# Patient Record
Sex: Female | Born: 1950 | Race: White | Hispanic: No | Marital: Single | Smoking: Never smoker
Health system: Southern US, Community
[De-identification: ages and names within clinical notes are randomized; demographics above are authoritative.]

## PROBLEM LIST (undated history)

## (undated) DIAGNOSIS — R05 Cough: Secondary | ICD-10-CM

## (undated) DIAGNOSIS — N329 Bladder disorder, unspecified: Secondary | ICD-10-CM

## (undated) DIAGNOSIS — H9190 Unspecified hearing loss, unspecified ear: Secondary | ICD-10-CM

## (undated) DIAGNOSIS — M791 Myalgia, unspecified site: Secondary | ICD-10-CM

## (undated) DIAGNOSIS — R112 Nausea with vomiting, unspecified: Secondary | ICD-10-CM

## (undated) DIAGNOSIS — R059 Cough, unspecified: Secondary | ICD-10-CM

## (undated) DIAGNOSIS — Z9889 Other specified postprocedural states: Secondary | ICD-10-CM

## (undated) DIAGNOSIS — K589 Irritable bowel syndrome without diarrhea: Secondary | ICD-10-CM

## (undated) DIAGNOSIS — J349 Unspecified disorder of nose and nasal sinuses: Secondary | ICD-10-CM

## (undated) DIAGNOSIS — F419 Anxiety disorder, unspecified: Secondary | ICD-10-CM

## (undated) DIAGNOSIS — J45909 Unspecified asthma, uncomplicated: Secondary | ICD-10-CM

## (undated) DIAGNOSIS — K219 Gastro-esophageal reflux disease without esophagitis: Secondary | ICD-10-CM

## (undated) DIAGNOSIS — M79669 Pain in unspecified lower leg: Secondary | ICD-10-CM

## (undated) DIAGNOSIS — R609 Edema, unspecified: Secondary | ICD-10-CM

## (undated) HISTORY — PX: COLONOSCOPY: SHX174

## (undated) HISTORY — PX: DILATION AND CURETTAGE OF UTERUS: SHX78

## (undated) HISTORY — PX: KNEE SURGERY: SHX244

## (undated) HISTORY — DX: Gastro-esophageal reflux disease without esophagitis: K21.9

## (undated) HISTORY — DX: Myalgia, unspecified site: M79.10

## (undated) HISTORY — DX: Unspecified hearing loss, unspecified ear: H91.90

## (undated) HISTORY — DX: Pain in unspecified lower leg: M79.669

## (undated) HISTORY — PX: UPPER GI ENDOSCOPY: SHX6162

## (undated) HISTORY — DX: Anxiety disorder, unspecified: F41.9

## (undated) HISTORY — DX: Unspecified disorder of nose and nasal sinuses: J34.9

## (undated) HISTORY — DX: Irritable bowel syndrome, unspecified: K58.9

## (undated) HISTORY — DX: Unspecified asthma, uncomplicated: J45.909

## (undated) HISTORY — DX: Cough, unspecified: R05.9

## (undated) HISTORY — DX: Bladder disorder, unspecified: N32.9

## (undated) HISTORY — DX: Edema, unspecified: R60.9

## (undated) HISTORY — DX: Cough: R05

---

## 1999-01-11 ENCOUNTER — Other Ambulatory Visit: Admission: RE | Admit: 1999-01-11 | Discharge: 1999-01-11 | Payer: Self-pay | Admitting: Obstetrics and Gynecology

## 2000-05-24 ENCOUNTER — Ambulatory Visit (HOSPITAL_COMMUNITY): Admission: RE | Admit: 2000-05-24 | Discharge: 2000-05-24 | Payer: Self-pay | Admitting: Gastroenterology

## 2001-02-18 ENCOUNTER — Ambulatory Visit (HOSPITAL_BASED_OUTPATIENT_CLINIC_OR_DEPARTMENT_OTHER): Admission: RE | Admit: 2001-02-18 | Discharge: 2001-02-18 | Payer: Self-pay | Admitting: *Deleted

## 2001-04-30 HISTORY — PX: GALLBLADDER SURGERY: SHX652

## 2001-08-11 ENCOUNTER — Encounter: Admission: RE | Admit: 2001-08-11 | Discharge: 2001-11-09 | Payer: Self-pay

## 2001-08-19 ENCOUNTER — Inpatient Hospital Stay (HOSPITAL_COMMUNITY): Admission: EM | Admit: 2001-08-19 | Discharge: 2001-08-23 | Payer: Self-pay | Admitting: Family Medicine

## 2001-09-11 ENCOUNTER — Encounter: Payer: Self-pay | Admitting: Family Medicine

## 2001-09-11 ENCOUNTER — Encounter: Admission: RE | Admit: 2001-09-11 | Discharge: 2001-09-11 | Payer: Self-pay | Admitting: Family Medicine

## 2001-09-28 ENCOUNTER — Emergency Department (HOSPITAL_COMMUNITY): Admission: EM | Admit: 2001-09-28 | Discharge: 2001-09-29 | Payer: Self-pay | Admitting: Emergency Medicine

## 2001-09-30 ENCOUNTER — Ambulatory Visit (HOSPITAL_COMMUNITY): Admission: RE | Admit: 2001-09-30 | Discharge: 2001-09-30 | Payer: Self-pay | Admitting: Gastroenterology

## 2001-09-30 ENCOUNTER — Encounter: Payer: Self-pay | Admitting: Gastroenterology

## 2001-10-07 ENCOUNTER — Ambulatory Visit (HOSPITAL_COMMUNITY): Admission: RE | Admit: 2001-10-07 | Discharge: 2001-10-07 | Payer: Self-pay | Admitting: Gastroenterology

## 2001-10-07 ENCOUNTER — Encounter: Payer: Self-pay | Admitting: Gastroenterology

## 2001-11-13 ENCOUNTER — Encounter: Admission: RE | Admit: 2001-11-13 | Discharge: 2001-11-13 | Payer: Self-pay | Admitting: Family Medicine

## 2001-11-13 ENCOUNTER — Encounter: Payer: Self-pay | Admitting: Family Medicine

## 2002-04-03 ENCOUNTER — Encounter: Payer: Self-pay | Admitting: Surgery

## 2002-04-03 ENCOUNTER — Observation Stay (HOSPITAL_COMMUNITY): Admission: RE | Admit: 2002-04-03 | Discharge: 2002-04-04 | Payer: Self-pay | Admitting: Surgery

## 2003-10-01 ENCOUNTER — Other Ambulatory Visit: Admission: RE | Admit: 2003-10-01 | Discharge: 2003-10-01 | Payer: Self-pay | Admitting: Obstetrics and Gynecology

## 2003-11-22 ENCOUNTER — Ambulatory Visit (HOSPITAL_COMMUNITY): Admission: RE | Admit: 2003-11-22 | Discharge: 2003-11-22 | Payer: Self-pay | Admitting: Gastroenterology

## 2004-09-22 ENCOUNTER — Ambulatory Visit (HOSPITAL_BASED_OUTPATIENT_CLINIC_OR_DEPARTMENT_OTHER): Admission: RE | Admit: 2004-09-22 | Discharge: 2004-09-22 | Payer: Self-pay | Admitting: Otolaryngology

## 2004-09-24 ENCOUNTER — Ambulatory Visit: Payer: Self-pay | Admitting: Internal Medicine

## 2004-12-04 ENCOUNTER — Other Ambulatory Visit: Admission: RE | Admit: 2004-12-04 | Discharge: 2004-12-04 | Payer: Self-pay | Admitting: Obstetrics and Gynecology

## 2011-06-08 ENCOUNTER — Other Ambulatory Visit: Payer: Self-pay | Admitting: Occupational Medicine

## 2011-06-08 ENCOUNTER — Ambulatory Visit: Payer: Self-pay

## 2011-06-08 DIAGNOSIS — M25559 Pain in unspecified hip: Secondary | ICD-10-CM

## 2012-09-11 ENCOUNTER — Ambulatory Visit
Admission: RE | Admit: 2012-09-11 | Discharge: 2012-09-11 | Disposition: A | Discharge status: Home / Self Care | Payer: BC Managed Care – PPO | Source: Ambulatory Visit | Attending: Family Medicine | Admitting: Family Medicine

## 2012-09-11 ENCOUNTER — Other Ambulatory Visit: Payer: Self-pay | Admitting: Family Medicine

## 2012-09-11 DIAGNOSIS — M542 Cervicalgia: Secondary | ICD-10-CM

## 2012-09-11 DIAGNOSIS — S0990XA Unspecified injury of head, initial encounter: Secondary | ICD-10-CM

## 2013-03-19 ENCOUNTER — Ambulatory Visit (INDEPENDENT_AMBULATORY_CARE_PROVIDER_SITE_OTHER): Payer: BC Managed Care – PPO

## 2013-03-19 ENCOUNTER — Encounter (INDEPENDENT_AMBULATORY_CARE_PROVIDER_SITE_OTHER): Payer: Self-pay

## 2013-03-19 VITALS — BP 131/83 | HR 79 | Resp 18

## 2013-03-19 DIAGNOSIS — M7750 Other enthesopathy of unspecified foot: Secondary | ICD-10-CM

## 2013-03-19 DIAGNOSIS — M7751 Other enthesopathy of right foot: Secondary | ICD-10-CM

## 2013-03-19 DIAGNOSIS — M2170 Unequal limb length (acquired), unspecified site: Secondary | ICD-10-CM

## 2013-03-19 NOTE — Patient Instructions (Signed)

## 2013-03-19 NOTE — Progress Notes (Signed)
  Subjective:    Patient ID: Michelle Klein, female    DOB: 1951/04/03, 62 y.o.   MRN: [id number]  HPI right foot has been bothering me for about 2009 and hurt my leg and i had knee surgery and ankle swells and going to physical therapy for range of motion leg and back and foot and the orthotic is not in a neutral position and goes to the outside of the right foot    Review of Systems  Constitutional: Negative.   HENT: Positive for congestion.   Eyes: Negative.   Respiratory: Positive for cough.   Cardiovascular: Negative.   Gastrointestinal:       IBS and hianal hernia  Endocrine: Negative.   Genitourinary:       Leaky bladder   Musculoskeletal: Positive for back pain.  Skin: Negative.   Allergic/Immunologic: Positive for environmental allergies.       Dust   Neurological: Negative.   Hematological: Negative.   Psychiatric/Behavioral: Negative.        Objective:   Physical Exam Neurovascular status is intact with pedal pulses palpable DP and PT posterior were for Refill time 3 seconds all digits epicritic and proprioceptive sensations appear to be intact although maybe diminished on the right side secondary to history of right knee surgery replacement back in 2009. Patient does have some difficulty of instability and gait is going to physical therapy right now since several falls. Patient feels like she is walking on the outside of her foot or rolling into a varus position however exam patient does have roughly normal range of motion ankle subtalar and midtarsal joints there is no crepitus patient does have rectus foot type slight rotatory changes on weightbearing without orthoses. The orthotic she has has a heel lift on the right side and she is only to prescription C. secondary to her surgery. The orthotic currently wearing is posterior to neutral position there is no varus or valgus posting on the orthotic and factors maintaining a neutral position of the foot patient was  advised I would not recommend any valgus posting to the orthotic which would in fact cause a valgus stress on the knee and possibly exacerbate any knee or hip problems she may have. I do feel the issue continues be a matter of proprioceptive sensations secondary to her previous surgeries and lack of input from her knee.      Assessment & Plan:  Assessment this time patient continues to have some hindfoot rear foot capsulitis and promontory changes maintain plantar fascial symptomology with functional orthoses are posted to a neutral or vertical position. Patient's orthotics are wearing and did request and At this time we'll schedule for orthotic casting for a new orthotic at her convenience with him next week or 2 at this time maintain orthotics is currently a fit and contour well continue with exercise programs with physical therapy help improve proprioceptive sensation and strength. Followup when orthotics ready for fitting and  Alvan Dame DP

## 2013-04-01 ENCOUNTER — Other Ambulatory Visit: Payer: BC Managed Care – PPO

## 2014-02-12 ENCOUNTER — Other Ambulatory Visit: Payer: Self-pay | Admitting: Orthopedic Surgery

## 2014-02-24 ENCOUNTER — Encounter (HOSPITAL_BASED_OUTPATIENT_CLINIC_OR_DEPARTMENT_OTHER): Payer: Self-pay | Admitting: *Deleted

## 2014-02-24 NOTE — Progress Notes (Signed)
No labs needed

## 2014-02-26 ENCOUNTER — Encounter (HOSPITAL_BASED_OUTPATIENT_CLINIC_OR_DEPARTMENT_OTHER): Payer: Medicare Other | Admitting: Certified Registered"

## 2014-02-26 ENCOUNTER — Encounter (HOSPITAL_BASED_OUTPATIENT_CLINIC_OR_DEPARTMENT_OTHER)
Admission: RE | Disposition: A | Discharge status: Home / Self Care | Payer: Self-pay | Source: Ambulatory Visit | Attending: Orthopedic Surgery

## 2014-02-26 ENCOUNTER — Encounter (HOSPITAL_BASED_OUTPATIENT_CLINIC_OR_DEPARTMENT_OTHER): Payer: Self-pay | Admitting: Certified Registered"

## 2014-02-26 ENCOUNTER — Ambulatory Visit (HOSPITAL_BASED_OUTPATIENT_CLINIC_OR_DEPARTMENT_OTHER): Payer: Medicare Other | Admitting: Certified Registered"

## 2014-02-26 ENCOUNTER — Ambulatory Visit (HOSPITAL_BASED_OUTPATIENT_CLINIC_OR_DEPARTMENT_OTHER)
Admission: RE | Admit: 2014-02-26 | Discharge: 2014-02-26 | Disposition: A | Discharge status: Home / Self Care | Payer: Medicare Other | Source: Ambulatory Visit | Attending: Orthopedic Surgery | Admitting: Orthopedic Surgery

## 2014-02-26 DIAGNOSIS — M67442 Ganglion, left hand: Secondary | ICD-10-CM | POA: Insufficient documentation

## 2014-02-26 DIAGNOSIS — M19042 Primary osteoarthritis, left hand: Secondary | ICD-10-CM | POA: Diagnosis not present

## 2014-02-26 DIAGNOSIS — K589 Irritable bowel syndrome without diarrhea: Secondary | ICD-10-CM | POA: Insufficient documentation

## 2014-02-26 DIAGNOSIS — H9190 Unspecified hearing loss, unspecified ear: Secondary | ICD-10-CM | POA: Diagnosis not present

## 2014-02-26 DIAGNOSIS — Z880 Allergy status to penicillin: Secondary | ICD-10-CM | POA: Diagnosis not present

## 2014-02-26 DIAGNOSIS — Z885 Allergy status to narcotic agent status: Secondary | ICD-10-CM | POA: Insufficient documentation

## 2014-02-26 DIAGNOSIS — F419 Anxiety disorder, unspecified: Secondary | ICD-10-CM | POA: Insufficient documentation

## 2014-02-26 DIAGNOSIS — J45909 Unspecified asthma, uncomplicated: Secondary | ICD-10-CM | POA: Diagnosis not present

## 2014-02-26 DIAGNOSIS — K219 Gastro-esophageal reflux disease without esophagitis: Secondary | ICD-10-CM | POA: Insufficient documentation

## 2014-02-26 DIAGNOSIS — Z79899 Other long term (current) drug therapy: Secondary | ICD-10-CM | POA: Diagnosis not present

## 2014-02-26 DIAGNOSIS — Z882 Allergy status to sulfonamides status: Secondary | ICD-10-CM | POA: Diagnosis not present

## 2014-02-26 DIAGNOSIS — Z886 Allergy status to analgesic agent status: Secondary | ICD-10-CM | POA: Diagnosis not present

## 2014-02-26 DIAGNOSIS — L729 Follicular cyst of the skin and subcutaneous tissue, unspecified: Secondary | ICD-10-CM | POA: Diagnosis present

## 2014-02-26 HISTORY — PX: MASS EXCISION: SHX2000

## 2014-02-26 HISTORY — DX: Nausea with vomiting, unspecified: R11.2

## 2014-02-26 HISTORY — DX: Other specified postprocedural states: Z98.890

## 2014-02-26 LAB — POCT HEMOGLOBIN-HEMACUE: Hemoglobin: 13.4 g/dL (ref 12.0–15.0)

## 2014-02-26 SURGERY — EXCISION MASS
Anesthesia: Monitor Anesthesia Care | Site: Finger | Laterality: Left

## 2014-02-26 MED ORDER — MIDAZOLAM HCL 5 MG/5ML IJ SOLN
INTRAMUSCULAR | Status: DC | PRN
  Administered 2014-02-26: 2 mg via INTRAVENOUS

## 2014-02-26 MED ORDER — FENTANYL CITRATE 0.05 MG/ML IJ SOLN
INTRAMUSCULAR | Status: AC
  Filled 2014-02-26: qty 4

## 2014-02-26 MED ORDER — HYDROCODONE-ACETAMINOPHEN 5-325 MG PO TABS: ORAL_TABLET | ORAL | Status: AC

## 2014-02-26 MED ORDER — PROPOFOL 10 MG/ML IV BOLUS
INTRAVENOUS | Status: DC | PRN
  Administered 2014-02-26: 40 mg via INTRAVENOUS

## 2014-02-26 MED ORDER — VANCOMYCIN HCL IN DEXTROSE 1-5 GM/200ML-% IV SOLN
INTRAVENOUS | Status: AC
  Filled 2014-02-26: qty 200

## 2014-02-26 MED ORDER — LACTATED RINGERS IV SOLN
INTRAVENOUS | Status: DC
  Administered 2014-02-26 (×2): via INTRAVENOUS

## 2014-02-26 MED ORDER — ONDANSETRON HCL 4 MG/2ML IJ SOLN
INTRAMUSCULAR | Status: DC | PRN
  Administered 2014-02-26: 4 mg via INTRAVENOUS

## 2014-02-26 MED ORDER — MIDAZOLAM HCL 2 MG/2ML IJ SOLN: 1.0000 mg | INTRAMUSCULAR | Status: DC | PRN

## 2014-02-26 MED ORDER — FENTANYL CITRATE 0.05 MG/ML IJ SOLN: 50.0000 ug | INTRAMUSCULAR | Status: DC | PRN

## 2014-02-26 MED ORDER — MIDAZOLAM HCL 2 MG/2ML IJ SOLN
INTRAMUSCULAR | Status: AC
  Filled 2014-02-26: qty 2

## 2014-02-26 MED ORDER — FENTANYL CITRATE 0.05 MG/ML IJ SOLN
INTRAMUSCULAR | Status: DC | PRN
  Administered 2014-02-26 (×2): 50 ug via INTRAVENOUS

## 2014-02-26 MED ORDER — CHLORHEXIDINE GLUCONATE 4 % EX LIQD: 60.0000 mL | Freq: Once | CUTANEOUS | Status: DC

## 2014-02-26 MED ORDER — PROPOFOL INFUSION 10 MG/ML OPTIME
INTRAVENOUS | Status: DC | PRN
  Administered 2014-02-26: 100 ug/kg/min via INTRAVENOUS

## 2014-02-26 MED ORDER — VANCOMYCIN HCL IN DEXTROSE 1-5 GM/200ML-% IV SOLN
1000.0000 mg | INTRAVENOUS | Status: AC
  Administered 2014-02-26: 1000 mg via INTRAVENOUS

## 2014-02-26 MED ORDER — BUPIVACAINE HCL (PF) 0.25 % IJ SOLN
INTRAMUSCULAR | Status: DC | PRN
Start: 2014-02-26 — End: 2014-02-26
  Administered 2014-02-26: 10 mL

## 2014-02-26 SURGICAL SUPPLY — 58 items
APL SKNCLS STERI-STRIP NONHPOA (GAUZE/BANDAGES/DRESSINGS)
BANDAGE COBAN STERILE 2 (GAUZE/BANDAGES/DRESSINGS) IMPLANT
BANDAGE ELASTIC 3 VELCRO ST LF (GAUZE/BANDAGES/DRESSINGS) IMPLANT
BENZOIN TINCTURE PRP APPL 2/3 (GAUZE/BANDAGES/DRESSINGS) IMPLANT
BLADE MINI RND TIP GREEN BEAV (BLADE) IMPLANT
BLADE SURG 15 STRL LF DISP TIS (BLADE) ×2 IMPLANT
BLADE SURG 15 STRL SS (BLADE) ×6
BNDG CMPR 9X4 STRL LF SNTH (GAUZE/BANDAGES/DRESSINGS)
BNDG CMPR MD 5X2 ELC HKLP STRL (GAUZE/BANDAGES/DRESSINGS)
BNDG COHESIVE 1X5 TAN STRL LF (GAUZE/BANDAGES/DRESSINGS) ×2 IMPLANT
BNDG CONFORM 2 STRL LF (GAUZE/BANDAGES/DRESSINGS) IMPLANT
BNDG ELASTIC 2 VLCR STRL LF (GAUZE/BANDAGES/DRESSINGS) IMPLANT
BNDG ESMARK 4X9 LF (GAUZE/BANDAGES/DRESSINGS) IMPLANT
BNDG GAUZE 1X2.1 STRL (MISCELLANEOUS) IMPLANT
BNDG GAUZE ELAST 4 BULKY (GAUZE/BANDAGES/DRESSINGS) IMPLANT
BNDG PLASTER X FAST 3X3 WHT LF (CAST SUPPLIES) IMPLANT
BNDG PLSTR 9X3 FST ST WHT (CAST SUPPLIES)
CHLORAPREP W/TINT 26ML (MISCELLANEOUS) ×3 IMPLANT
CLOSURE WOUND 1/2 X4 (GAUZE/BANDAGES/DRESSINGS)
CORDS BIPOLAR (ELECTRODE) ×3 IMPLANT
COVER BACK TABLE 60X90IN (DRAPES) ×3 IMPLANT
COVER MAYO STAND STRL (DRAPES) ×3 IMPLANT
CUFF TOURNIQUET SINGLE 18IN (TOURNIQUET CUFF) ×3 IMPLANT
DRAPE EXTREMITY T 121X128X90 (DRAPE) ×3 IMPLANT
DRAPE SURG 17X23 STRL (DRAPES) ×3 IMPLANT
GAUZE SPONGE 4X4 12PLY STRL (GAUZE/BANDAGES/DRESSINGS) ×3 IMPLANT
GAUZE XEROFORM 1X8 LF (GAUZE/BANDAGES/DRESSINGS) ×3 IMPLANT
GLOVE BIO SURGEON STRL SZ7.5 (GLOVE) ×3 IMPLANT
GLOVE BIOGEL M 7.0 STRL (GLOVE) ×4 IMPLANT
GLOVE BIOGEL PI IND STRL 7.5 (GLOVE) IMPLANT
GLOVE BIOGEL PI IND STRL 8 (GLOVE) ×1 IMPLANT
GLOVE BIOGEL PI INDICATOR 7.5 (GLOVE) ×2
GLOVE BIOGEL PI INDICATOR 8 (GLOVE) ×2
GOWN STRL REUS W/ TWL LRG LVL3 (GOWN DISPOSABLE) ×1 IMPLANT
GOWN STRL REUS W/TWL LRG LVL3 (GOWN DISPOSABLE) ×3
GOWN STRL REUS W/TWL XL LVL3 (GOWN DISPOSABLE) ×3 IMPLANT
NDL HYPO 25X1 1.5 SAFETY (NEEDLE) ×1 IMPLANT
NEEDLE HYPO 25X1 1.5 SAFETY (NEEDLE) ×3 IMPLANT
NS IRRIG 1000ML POUR BTL (IV SOLUTION) ×3 IMPLANT
PACK BASIN DAY SURGERY FS (CUSTOM PROCEDURE TRAY) ×3 IMPLANT
PAD CAST 3X4 CTTN HI CHSV (CAST SUPPLIES) IMPLANT
PAD CAST 4YDX4 CTTN HI CHSV (CAST SUPPLIES) IMPLANT
PADDING CAST ABS 4INX4YD NS (CAST SUPPLIES) ×2
PADDING CAST ABS COTTON 4X4 ST (CAST SUPPLIES) ×1 IMPLANT
PADDING CAST COTTON 3X4 STRL (CAST SUPPLIES)
PADDING CAST COTTON 4X4 STRL (CAST SUPPLIES)
SPLINT FINGER 3.25 911903 (SOFTGOODS) ×2 IMPLANT
STOCKINETTE 4X48 STRL (DRAPES) ×3 IMPLANT
STRIP CLOSURE SKIN 1/2X4 (GAUZE/BANDAGES/DRESSINGS) IMPLANT
SUT ETHILON 3 0 PS 1 (SUTURE) IMPLANT
SUT ETHILON 4 0 PS 2 18 (SUTURE) ×3 IMPLANT
SUT ETHILON 5 0 P 3 18 (SUTURE)
SUT NYLON ETHILON 5-0 P-3 1X18 (SUTURE) IMPLANT
SUT VIC AB 4-0 P2 18 (SUTURE) IMPLANT
SYR BULB 3OZ (MISCELLANEOUS) ×3 IMPLANT
SYR CONTROL 10ML LL (SYRINGE) ×3 IMPLANT
TOWEL OR 17X24 6PK STRL BLUE (TOWEL DISPOSABLE) ×6 IMPLANT
UNDERPAD 30X30 INCONTINENT (UNDERPADS AND DIAPERS) ×3 IMPLANT

## 2014-02-26 NOTE — H&P (Signed)
Michelle Klein is an 63 y.o. female.   Chief Complaint: left index mucoid cyst HPI: 63 yo rhd female states she has had cyst on left index finger x several months.  It is bothersome to her.  Had it aspirated once with recurrence.  She wishes to have it removed.  Past Medical History  Diagnosis Date  . Hearing loss   . Sinus problem   . Muscle pain   . Swelling   . Calf pain   . Bladder problem   . Asthma   . Cough   . GERD (gastroesophageal reflux disease)   . IBS (irritable bowel syndrome)   . Anxiety   . PONV (postoperative nausea and vomiting)     Past Surgical History  Procedure Laterality Date  . Gallbladder surgery  2003  . Knee surgery  [phone]    right knee x 3   . Dilation and curettage of uterus    . Upper gi endoscopy      x3  . Colonoscopy      History reviewed. No pertinent family history. Social History:  reports that she has never smoked. She has never used smokeless tobacco. She reports that she drinks alcohol. She reports that she does not use illicit drugs.  Allergies:  Allergies  Allergen Reactions  . Codeine Nausea And Vomiting  . Penicillins Hives  . Sulfa Antibiotics Nausea And Vomiting  . Ultram [Tramadol] Hives  . Adhesive [Tape] Rash    "sometimes causes rash"    Medications Prior to Admission  Medication Sig Dispense Refill  . ADVAIR DISKUS 100-50 MCG/DOSE AEPB       . ALPRAZolam (XANAX) 0.25 MG tablet Take 0.25 mg by mouth at bedtime as needed for anxiety.      Marland Kitchen. dexlansoprazole (DEXILANT) 60 MG capsule Take 60 mg by mouth daily.      Marland Kitchen. SINGULAIR 10 MG tablet at bedtime.       . sucralfate (CARAFATE) 1 G tablet Take 1 g by mouth 3 (three) times daily between meals. And hs      . dextromethorphan-guaiFENesin (MUCINEX DM) 30-600 MG per 12 hr tablet Take 1 tablet by mouth 2 (two) times daily.      Marland Kitchen. NASONEX 50 MCG/ACT nasal spray       . NEXIUM 40 MG capsule as needed.         Results for orders placed during the hospital  encounter of 02/26/14 (from the past 48 hour(s))  POCT HEMOGLOBIN-HEMACUE     Status: None   Collection Time    02/26/14 12:20 PM      Result Value Ref Range   Hemoglobin 13.4  12.0 - 15.0 g/dL    No results found.   A comprehensive review of systems was negative.  Blood pressure 148/77, pulse 84, temperature 98.2 F (36.8 C), temperature source Oral, resp. rate 18, height 5\' 4"  (1.626 m), weight 83.915 kg (185 lb), SpO2 97.00%.  General appearance: alert, cooperative and appears stated age Head: Normocephalic, without obvious abnormality, atraumatic Neck: supple, symmetrical, trachea midline Resp: clear to auscultation bilaterally Cardio: regular rate and rhythm GI: non tender Extremities: intact sensation and capillary refill all digits.  +epl/fpl/io.  ttp over cyst left index finger.  no wounds. Pulses: 2+ and symmetric Skin: Skin color, texture, turgor normal. 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  Non operative and operative treatment options were discussed with the patient and  patient wishes to proceed with operative treatment. Risks, benefits, and alternatives of surgery were discussed and the patient agrees with the plan of care.   Naphtali Zywicki R 02/26/2014, 1:21 PM

## 2014-02-26 NOTE — Discharge Instructions (Addendum)

## 2014-02-26 NOTE — Anesthesia Postprocedure Evaluation (Signed)
  Anesthesia Post-op Note  Patient: Michelle Klein  Procedure(s) Performed: Procedure(s): LEFT INDEX EXCISION MASS/DEBRIDEMENT DISTAL INTERPHALANGEAL JOINT (Left)  Patient Location: PACU  Anesthesia Type: MAC   Level of Consciousness: awake, alert  and oriented  Airway and Oxygen Therapy: Patient Spontanous Breathing  Post-op Pain: none  Post-op Assessment: Post-op Vital signs reviewed  Post-op Vital Signs: Reviewed  Last Vitals:  Filed Vitals:   02/26/14 1520  BP: 136/68  Pulse: 70  Temp: 36.5 C  Resp: 16    Complications: No apparent anesthesia complications

## 2014-02-26 NOTE — Anesthesia Procedure Notes (Signed)
Procedure Name: MAC Date/Time: 02/26/2014 1:39 PM Performed by: Curly ShoresRAFT, Mayvis Agudelo W Pre-anesthesia Checklist: Patient identified, Emergency Drugs available, Suction available and Patient being monitored Patient Re-evaluated:Patient Re-evaluated prior to inductionOxygen Delivery Method: Simple face mask Ventilation: Mask ventilation without difficulty Dental Injury: Teeth and Oropharynx as per pre-operative assessment

## 2014-02-26 NOTE — Transfer of Care (Signed)
Immediate Anesthesia Transfer of Care Note  Patient: Michelle Klein  Procedure(s) Performed: Procedure(s): LEFT INDEX EXCISION MASS/DEBRIDEMENT DISTAL INTERPHALANGEAL JOINT (Left)  Patient Location: PACU  Anesthesia Type:MAC  Level of Consciousness: awake, alert , oriented and patient cooperative  Airway & Oxygen Therapy: Patient Spontanous Breathing and Patient connected to face mask oxygen  Post-op Assessment: Report given to PACU RN, Post -op Vital signs reviewed and stable and Patient moving all extremities  Post vital signs: Reviewed and stable  Complications: No apparent anesthesia complications

## 2014-02-26 NOTE — Op Note (Signed)
[id number] 

## 2014-02-26 NOTE — Anesthesia Preprocedure Evaluation (Addendum)
Anesthesia Evaluation  Patient identified by MRN, date of birth, ID band Patient awake    Reviewed: Allergy & Precautions, H&P , NPO status , Patient's Chart, lab work & pertinent test results  History of Anesthesia Complications (+) PONV  Airway Mallampati: I  TM Distance: >3 FB Neck ROM: Full    Dental  (+) Teeth Intact, Dental Advisory Given   Pulmonary asthma ,  breath sounds clear to auscultation        Cardiovascular Rhythm:Regular Rate:Normal     Neuro/Psych    GI/Hepatic GERD-  Medicated and Controlled,  Endo/Other    Renal/GU      Musculoskeletal   Abdominal   Peds  Hematology   Anesthesia Other Findings   Reproductive/Obstetrics                             Anesthesia Physical Anesthesia Plan  ASA: II  Anesthesia Plan: MAC   Post-op Pain Management:    Induction: Intravenous  Airway Management Planned: Simple Face Mask  Additional Equipment:   Intra-op Plan:   Post-operative Plan: Extubation in OR  Informed Consent: I have reviewed the patients History and Physical, chart, labs and discussed the procedure including the risks, benefits and alternatives for the proposed anesthesia with the patient or authorized representative who has indicated his/her understanding and acceptance.   Dental advisory given  Plan Discussed with: CRNA, Anesthesiologist and Surgeon  Anesthesia Plan Comments:         Anesthesia Quick Evaluation

## 2014-02-26 NOTE — Brief Op Note (Signed)
02/26/2014  2:06 PM  PATIENT:  Michelle Klein  63 y.o. female  PRE-OPERATIVE DIAGNOSIS:  LEFT INDEX MUCOID CYST/DIP ARTHROSIS  POST-OPERATIVE DIAGNOSIS:  LEFT INDEX MUCOID CYST/DIP ARTHROSIS  PROCEDURE:  Procedure(s): LEFT INDEX EXCISION MASS/DEBRIDEMENT DISTAL INTERPHALANGEAL JOINT (Left)  SURGEON:  Surgeon(s) and Role:    * Betha LoaKevin Jeremih Dearmas, MD - Primary  PHYSICIAN ASSISTANT:   ASSISTANTS: none   ANESTHESIA:   local and MAC  EBL:  Total I/O In: 1000 [I.V.:1000] Out: -   BLOOD ADMINISTERED:none  DRAINS: none   LOCAL MEDICATIONS USED:  MARCAINE     SPECIMEN:  Source of Specimen:  left index finger  DISPOSITION OF SPECIMEN:  PATHOLOGY  COUNTS:  YES  TOURNIQUET:   Total Tourniquet Time Documented: Forearm (Left) - 15 minutes Total: Forearm (Left) - 15 minutes   DICTATION: .Other Dictation: Dictation Number [id number]  PLAN OF CARE: Discharge to home after PACU  PATIENT DISPOSITION:  PACU - hemodynamically stable.

## 2014-02-27 NOTE — Op Note (Signed)
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  PROCEDURE:  Left index finger excision of mass and debridement of distal interphalangeal joint.  SURGEON:  Betha LoaKevin Kaylean Tupou, MD  ASSISTANT:  None.  ANESTHESIA:  MAC with digital block.  IV FLUIDS:  Per anesthesia flow sheet.  ESTIMATED BLOOD LOSS:  Minimal.  COMPLICATIONS:  None.  SPECIMENS:  [name].  TOURNIQUET TIME:  15 minutes.  DISPOSITION:  Stable to PACU.  INDICATIONS:  [name] is a 63 year old female who has noted a cyst on the left index finger for several months.  It is bothersome to her. It has been aspirated once with recurrence.  She wishes to have it excised.  Risks, benefits, and alternatives of surgery were discussed including risk of blood loss, infection, damage to nerves, vessels, tendons, ligaments, bone; failure of surgery; need for additional surgery, complications with wound healing, continued pain, recurrence of the cyst.  She voiced understanding of these risks and elected to proceed.  OPERATIVE COURSE:  After being identified preoperatively by myself, the patient and I agreed upon procedure and site of procedure.  Surgical site was marked.  The risks, benefits, and alternatives of surgery were reviewed and she wished to proceed.  Surgical consent had been signed. She was given IV Ancef as preoperative antibiotic prophylaxis.  She was transferred to the operating room and placed on the operating room table in supine  position with the left upper extremity on arm board.  General anesthesia was induced by the anesthesiologist.  The left upper extremity was prepped and draped in normal sterile orthopedic fashion. Surgical pause was performed between surgeons, anesthesia, and operating room staff, and all were in agreement as to the patient, procedure, site of procedure.  Tourniquet at the proximal aspect of the forearm was inflated to 250 mmHg after exsanguination of the limb with Esmarch bandage.  A digital block had been performed with 10 mL of 0.25% plain Marcaine prior to prepping and draping, but after a surgical pause.  A hockey-stick shaped incision was made at the DIP joint of the left index finger and carried into subcutaneous tissues by spreading technique. The mass was identified.  It was excised and sent to Pathology for examination.  There was also some mass on the radial side of the DIP joint as well.  This was also removed.  The joint was entered from underneath the extensor tendon and debrided.  Synovectomy was performed with synovectomy rongeurs.  The wound was copiously irrigated with sterile saline.  It was closed with 4-0 nylon in a horizontal mattress fashion.  It was then dressed with sterile Xeroform, 4x4, and wrapped  with a Coban dressing.  An Alumafoam splint was placed and wrapped with Coban dressing lightly as well.  Tourniquet was deflated at 15 minutes. Fingertips were pink with brisk capillary refill after deflation of the tourniquet.  Operative drapes were broken down and the patient was awoken from anesthesia safely.  She was transferred back to stretcher and taken to PACU in stable condition.  I will see her back in the office in 1 week for postoperative followup.  I will give her Norco 5/325, one to two p.o. q.6 hours p.r.n. pain, dispensed #30.     Betha LoaKevin Brookley Spitler, MD     KK/MEDQ  D:  02/26/2014  T:  02/27/2014  Job:  [id number]

## 2014-09-06 IMAGING — CR DG CERVICAL SPINE COMPLETE 4+V
6 series · 6 of 6 positions shown · non-contrast
Comparison: Thoracic spine examination.

CLINICAL DATA: History of injury from fall.  History of pain.

CERVICAL SPINE - COMPLETE 4+ VIEW

[view not recorded (1 of 6)]
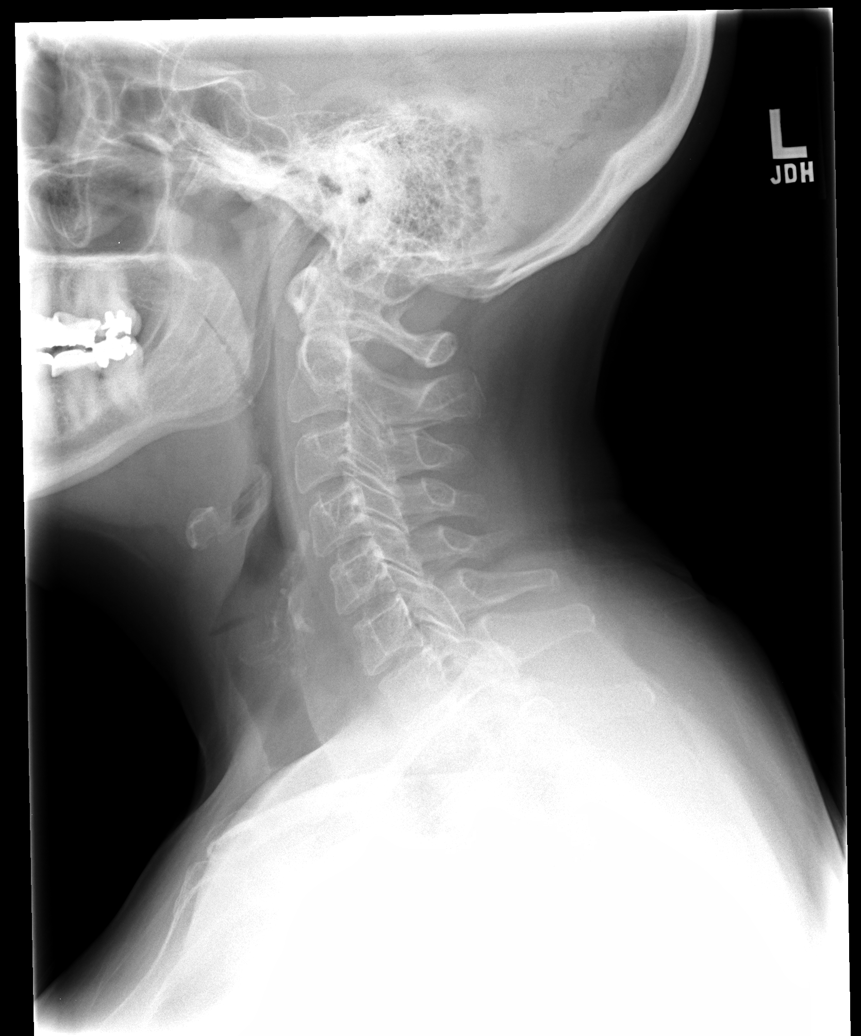

[view not recorded (2 of 6)]
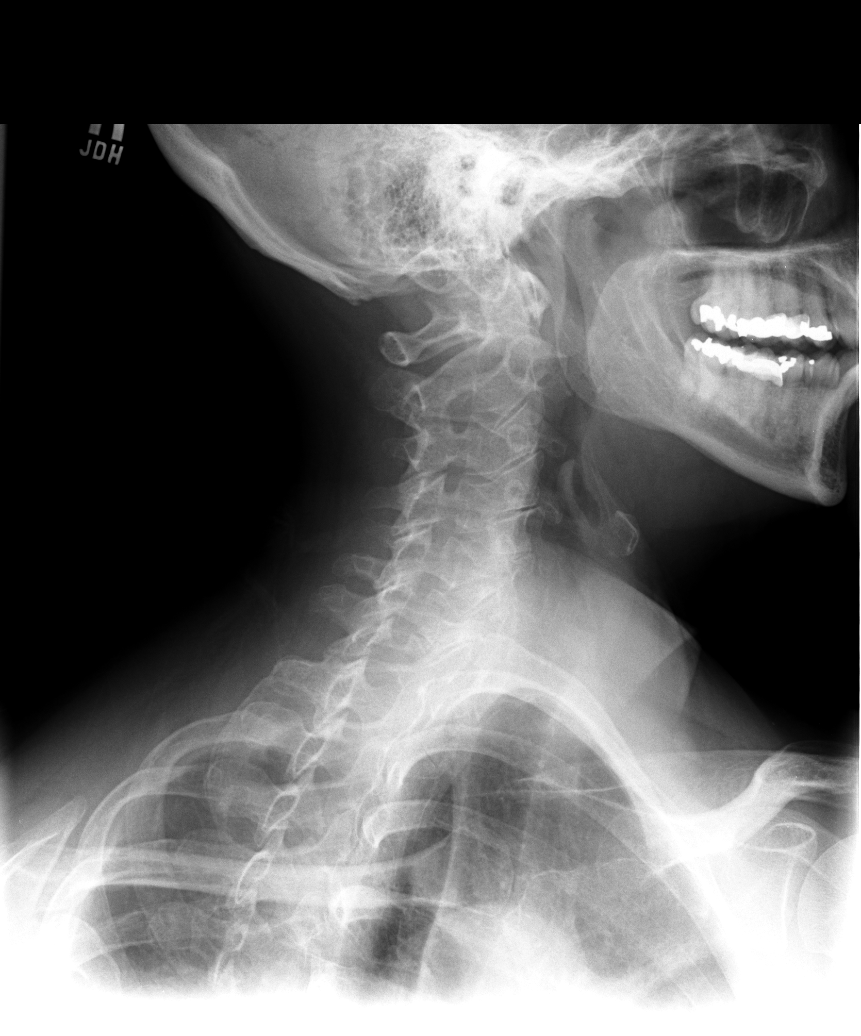

[view not recorded (3 of 6)]
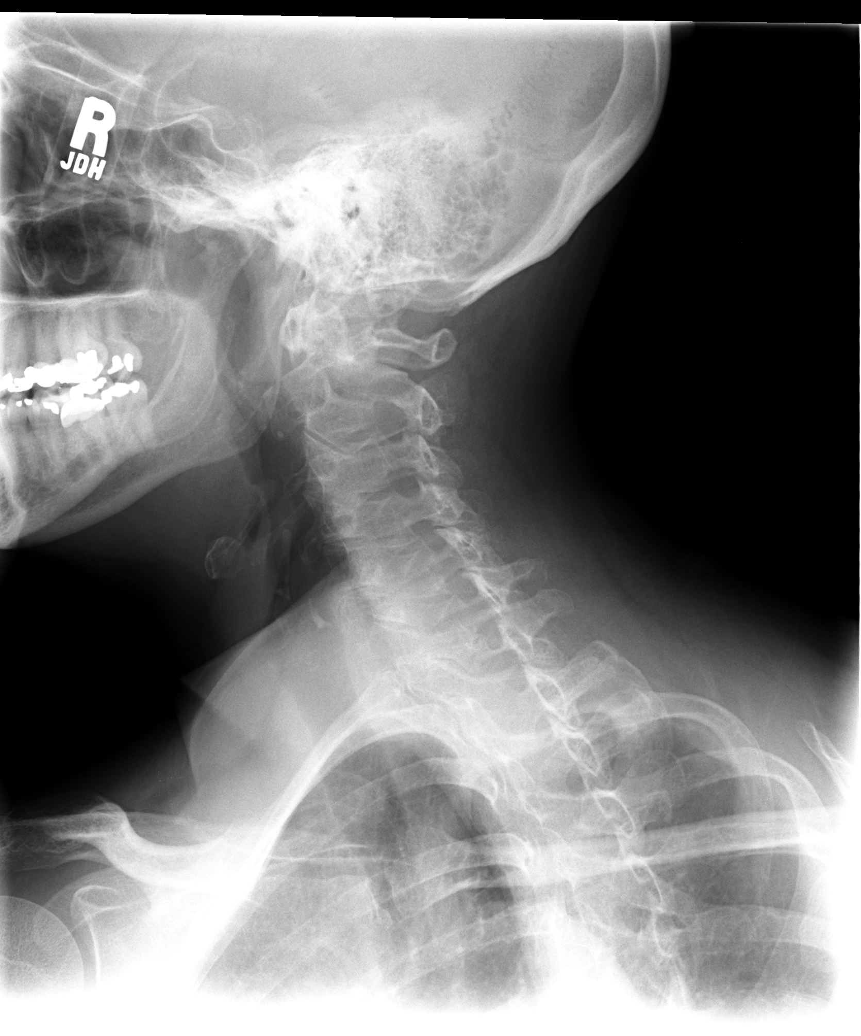

[view not recorded (4 of 6)]
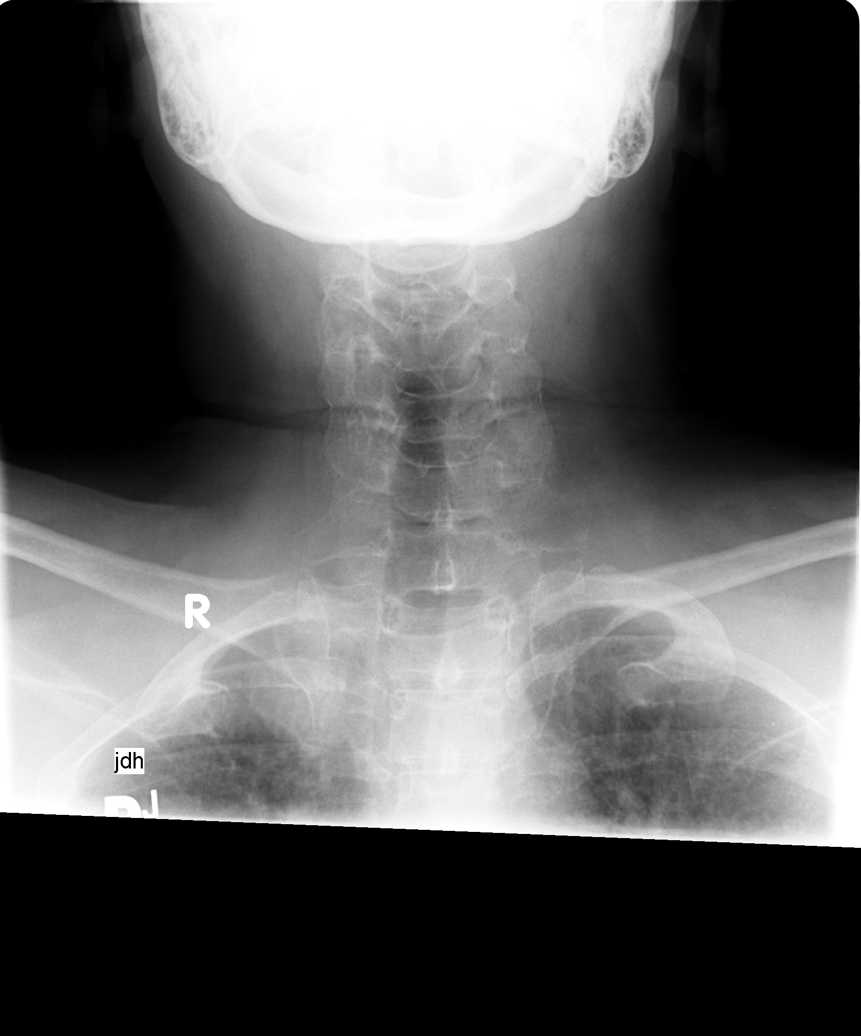

[view not recorded (5 of 6)]
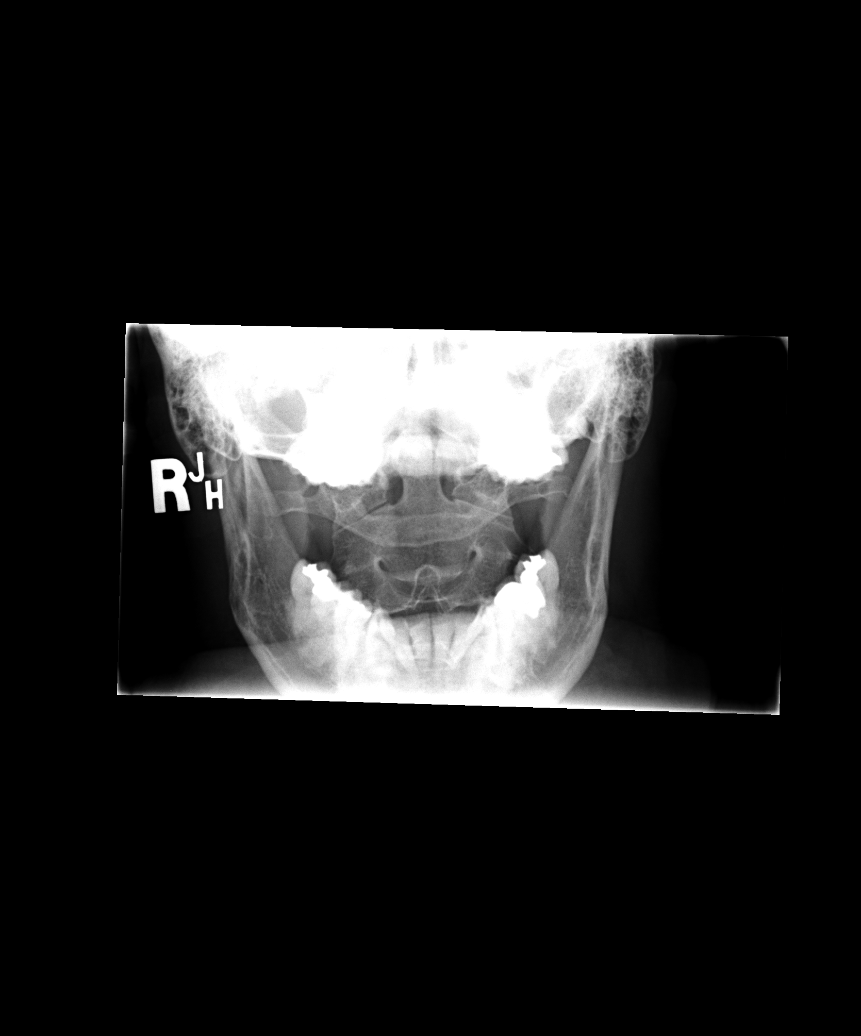

[view not recorded (6 of 6)]
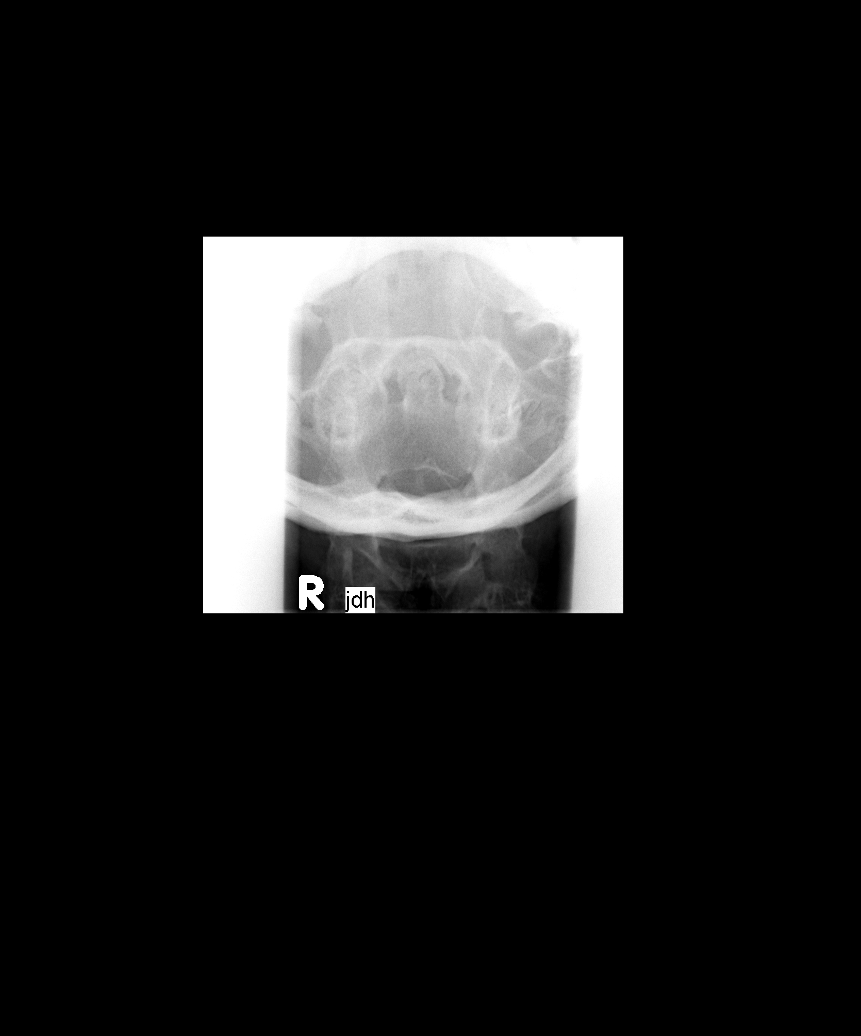

[6 of 6 positions shown; findings below may reference images not displayed]

FINDINGS: No prevertebral soft tissue swelling is evident.
Intervertebral disc spaces are relatively well maintained.  There
may be very slight narrowing of the space at the level of C 6 - C7.
There is very minimal degenerative spondylosis for age.  No
fracture, subluxation, bony destruction, or dislocation is evident.
There is slightly osteopenic appearance of the bones. Foramina
appear patent.  No cervical ribs are seen.
IMPRESSION: Minimal degenerative spondylosis.  No fracture or dislocation
evident.
